# Patient Record
Sex: Female | Born: 1975 | Race: White | Hispanic: No | Marital: Married | Smoking: Never smoker
Health system: Southern US, Community
[De-identification: ages and names within clinical notes are randomized; demographics above are authoritative.]

## PROBLEM LIST (undated history)

## (undated) DIAGNOSIS — K602 Anal fissure, unspecified: Secondary | ICD-10-CM

## (undated) DIAGNOSIS — D62 Acute posthemorrhagic anemia: Secondary | ICD-10-CM

## (undated) DIAGNOSIS — O09529 Supervision of elderly multigravida, unspecified trimester: Secondary | ICD-10-CM

## (undated) DIAGNOSIS — O09899 Supervision of other high risk pregnancies, unspecified trimester: Secondary | ICD-10-CM

## (undated) DIAGNOSIS — R51 Headache: Secondary | ICD-10-CM

## (undated) DIAGNOSIS — N611 Abscess of the breast and nipple: Secondary | ICD-10-CM

## (undated) DIAGNOSIS — Z8619 Personal history of other infectious and parasitic diseases: Secondary | ICD-10-CM

## (undated) HISTORY — PX: OTHER SURGICAL HISTORY: SHX169

## (undated) HISTORY — DX: Headache: R51

## (undated) HISTORY — PX: NASAL SEPTUM SURGERY: SHX37

## (undated) HISTORY — DX: Anal fissure, unspecified: K60.2

## (undated) HISTORY — DX: Abscess of the breast and nipple: N61.1

## (undated) HISTORY — DX: Supervision of other high risk pregnancies, unspecified trimester: O09.899

## (undated) HISTORY — DX: Supervision of elderly multigravida, unspecified trimester: O09.529

## (undated) HISTORY — DX: Personal history of other infectious and parasitic diseases: Z86.19

---

## 1998-11-28 ENCOUNTER — Other Ambulatory Visit: Admission: RE | Admit: 1998-11-28 | Discharge: 1998-11-28 | Payer: Self-pay | Admitting: Obstetrics and Gynecology

## 1999-02-20 ENCOUNTER — Emergency Department (HOSPITAL_COMMUNITY): Admission: EM | Admit: 1999-02-20 | Discharge: 1999-02-20 | Payer: Self-pay | Admitting: Emergency Medicine

## 1999-11-18 ENCOUNTER — Encounter: Payer: Self-pay | Admitting: Family Medicine

## 1999-11-18 ENCOUNTER — Ambulatory Visit (HOSPITAL_COMMUNITY): Admission: RE | Admit: 1999-11-18 | Discharge: 1999-11-18 | Payer: Self-pay | Admitting: Family Medicine

## 2000-04-12 ENCOUNTER — Other Ambulatory Visit: Admission: RE | Admit: 2000-04-12 | Discharge: 2000-04-12 | Payer: Self-pay | Admitting: *Deleted

## 2000-06-01 ENCOUNTER — Ambulatory Visit (HOSPITAL_BASED_OUTPATIENT_CLINIC_OR_DEPARTMENT_OTHER): Admission: RE | Admit: 2000-06-01 | Discharge: 2000-06-01 | Payer: Self-pay | Admitting: Otolaryngology

## 2001-04-25 ENCOUNTER — Other Ambulatory Visit: Admission: RE | Admit: 2001-04-25 | Discharge: 2001-04-25 | Payer: Self-pay | Admitting: Obstetrics and Gynecology

## 2002-01-25 ENCOUNTER — Encounter: Payer: Self-pay | Admitting: Family Medicine

## 2002-01-25 ENCOUNTER — Encounter: Admission: RE | Admit: 2002-01-25 | Discharge: 2002-01-25 | Payer: Self-pay | Admitting: Family Medicine

## 2002-06-11 ENCOUNTER — Other Ambulatory Visit: Admission: RE | Admit: 2002-06-11 | Discharge: 2002-06-11 | Payer: Self-pay | Admitting: Obstetrics and Gynecology

## 2003-08-16 ENCOUNTER — Other Ambulatory Visit: Admission: RE | Admit: 2003-08-16 | Discharge: 2003-08-16 | Payer: Self-pay | Admitting: Obstetrics and Gynecology

## 2003-09-04 ENCOUNTER — Ambulatory Visit (HOSPITAL_COMMUNITY): Admission: RE | Admit: 2003-09-04 | Discharge: 2003-09-04 | Payer: Self-pay | Admitting: Otolaryngology

## 2005-06-18 ENCOUNTER — Ambulatory Visit (HOSPITAL_COMMUNITY): Admission: RE | Admit: 2005-06-18 | Discharge: 2005-06-18 | Payer: Self-pay | Admitting: Internal Medicine

## 2005-06-23 ENCOUNTER — Encounter: Admission: RE | Admit: 2005-06-23 | Discharge: 2005-06-23 | Payer: Self-pay | Admitting: Orthopedic Surgery

## 2005-07-01 ENCOUNTER — Encounter (HOSPITAL_COMMUNITY): Admission: RE | Admit: 2005-07-01 | Discharge: 2005-07-31 | Payer: Self-pay | Admitting: Internal Medicine

## 2006-12-29 ENCOUNTER — Other Ambulatory Visit: Admission: RE | Admit: 2006-12-29 | Discharge: 2006-12-29 | Payer: Self-pay | Admitting: General Surgery

## 2006-12-29 ENCOUNTER — Encounter (INDEPENDENT_AMBULATORY_CARE_PROVIDER_SITE_OTHER): Payer: Self-pay | Admitting: General Surgery

## 2007-02-24 ENCOUNTER — Ambulatory Visit (HOSPITAL_COMMUNITY): Admission: RE | Admit: 2007-02-24 | Discharge: 2007-02-24 | Payer: Self-pay | Admitting: Otolaryngology

## 2007-06-29 HISTORY — PX: OTHER SURGICAL HISTORY: SHX169

## 2008-06-04 ENCOUNTER — Ambulatory Visit: Payer: Self-pay | Admitting: Gastroenterology

## 2008-06-28 HISTORY — PX: OTHER SURGICAL HISTORY: SHX169

## 2008-11-26 ENCOUNTER — Encounter: Admission: RE | Admit: 2008-11-26 | Discharge: 2008-11-26 | Payer: Self-pay | Admitting: Orthopedic Surgery

## 2009-05-16 ENCOUNTER — Encounter (INDEPENDENT_AMBULATORY_CARE_PROVIDER_SITE_OTHER): Payer: Self-pay | Admitting: *Deleted

## 2009-06-28 HISTORY — PX: UPPER GASTROINTESTINAL ENDOSCOPY: SHX188

## 2009-07-09 ENCOUNTER — Ambulatory Visit: Payer: Self-pay | Admitting: Gastroenterology

## 2009-07-09 DIAGNOSIS — Z8711 Personal history of peptic ulcer disease: Secondary | ICD-10-CM | POA: Insufficient documentation

## 2009-07-09 DIAGNOSIS — R1013 Epigastric pain: Secondary | ICD-10-CM | POA: Insufficient documentation

## 2009-07-10 ENCOUNTER — Encounter: Payer: Self-pay | Admitting: Gastroenterology

## 2009-07-11 ENCOUNTER — Ambulatory Visit (HOSPITAL_COMMUNITY): Admission: RE | Admit: 2009-07-11 | Discharge: 2009-07-11 | Payer: Self-pay | Admitting: Gastroenterology

## 2009-07-11 ENCOUNTER — Ambulatory Visit: Payer: Self-pay | Admitting: Gastroenterology

## 2009-09-26 DIAGNOSIS — G43909 Migraine, unspecified, not intractable, without status migrainosus: Secondary | ICD-10-CM | POA: Insufficient documentation

## 2009-09-26 DIAGNOSIS — K5900 Constipation, unspecified: Secondary | ICD-10-CM | POA: Insufficient documentation

## 2009-09-26 DIAGNOSIS — K279 Peptic ulcer, site unspecified, unspecified as acute or chronic, without hemorrhage or perforation: Secondary | ICD-10-CM | POA: Insufficient documentation

## 2009-09-26 DIAGNOSIS — R109 Unspecified abdominal pain: Secondary | ICD-10-CM | POA: Insufficient documentation

## 2009-12-18 IMAGING — RF DG FLUORO GUIDE NDL PLC/BX
1 series · 1 of 1 positions shown · non-contrast
Comparison: none

CLINICAL DATA: Left shoulder pain.  Recurrent labral tear. History
of previous posterior labral tear and repair.  Chronic/recurrent
shoulder pain.

[Series 1: (hospital) · 1 of 1 slices shown]
[im 1/1]
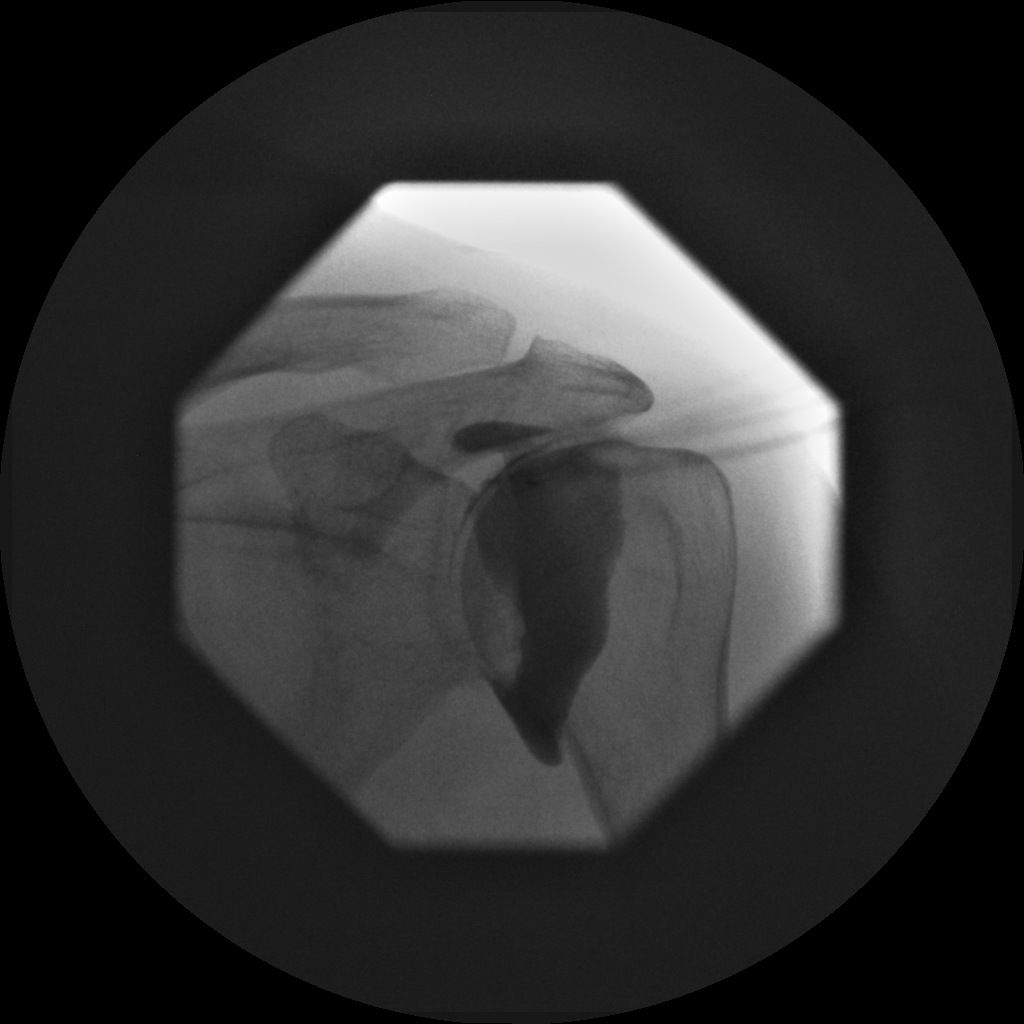

[1 of 1 positions shown; findings below may reference images not displayed]

SHOULDER ARTHROGRAM INJECTION FOR MRI

Procedure:  After a thorough discussion of risks and benefits of
the procedure, written and oral informed consent was obtained.  The
consent discussion included off label use of Magnevist, the risk of
bleeding, infection and injury to nerves and adjacent blood
vessels.  Extra-articular injection was also a possible risk
discussed.  Verbal consent was obtained by Dr. Nishita.

Preliminary localization was performed over the left shoulder.  The
area was marked over superior medial anterior humeral head.

After prep and drape in the usual sterile fashion, the skin and
deeper subcutaneous tissues were anesthetized with 1% Lidocaine
without Epinephrine.  Under fluoroscopic guidance, a 22 gauge
inch spinal needle was advanced into the joint over the superior
medial anterior humeral head using an anterior approach.  Intra-
articular injection of Lidocaine was performed which flowed freely
and subsequently the joint was distended with 10  ml of a [DATE]
dilution of Magnevist contrast.  The MR arthrogram solution was as
follows:  10 ml of Cares contrast agent, 0.2 mL Magnevist, 10 ml
of 1 % Lidocaine.  An end point was felt as well as the patient
experiencing pressure and the injection was discontinued, the
needle removed, and a sterile dressing applied. The patient was
taken to MRI for subsequent imaging.
The patient tolerated the procedure well and there were no
complications.

Fluoroscopy  Time: 1 minute 9 seconds
IMPRESSION: Successful left shoulder fluoroscopically guided MR arthrogram
injection.

## 2010-06-28 NOTE — L&D Delivery Note (Addendum)
Operative Delivery Note At 2:37 AM a viable and healthy female was delivered via Vaginal, Vacuum Investment banker, operational).  Presentation: vertex; Position: Occiput,, Anterior; Station: -2@(+)2 station.  Verbal consent: obtained from patient.  Risks and benefits discussed in detail.  Risks include, but are not limited to the risks of anesthesia, bleeding, infection, damage to maternal tissues, fetal cephalhematoma.  There is also the risk of inability to effect vaginal delivery of the head, or shoulder dystocia that cannot be resolved by established maneuvers, leading to the need for emergency cesarean section. Vacuum done for arrest of descent with pushing. APGAR: 9, 9; weight 7 lb 7.2 oz (3379 g).   Placenta status: Intact, Spontaneous.   Cord: 3 vessels with the following complications: None.  Cord pH: none  Anesthesia: Epidural  Instruments: mushroom vacuum Episiotomy: None Lacerations: 2nd degree;Sulcus;Perineal Suture Repair: 3.0 chromic Est. Blood Loss (mL): 300  Mom to postpartum.  Baby to nursery-stable.  Kim Guerrero A 06/14/2011, 3:04 AM

## 2010-07-28 NOTE — Assessment & Plan Note (Signed)
Summary: YR FU,ULCER/GU   Visit Type:  f/u Primary Care Provider:  Karleen Hampshire  Chief Complaint:  follow up- ulcer.  History of Present Illness: Kim Guerrero is here for one year f/u.  She had perforated duodenal ulcer with omental patch repair in 8/09.  She states she stopped Protonix in 12/09 when admitted at [facility] for migraine H/A and elevated blood pressure.  She states the pharmacist suspected Protonix caused the elevated BP and suggested she avoid PPIs.  She notes frequent epigastric burning like when she had the ulcer.  It's worse with acidic and spicy foods.  Water seems to help.  She has less frequent stools since her surgery.  One BM every 3 days. No melena or brbpr.  She is unhappy with her incision/scar.  She takes ibuprofen rarely but took it for approximately two weeks for dental pain.  Denies heartburn.      Current Medications (verified): 1)  Neurontin 300 Mg Caps (Gabapentin) .... 2 Three Times A Day 2)  Xanax 1 Mg Tabs (Alprazolam) .... Q 6 Hours 3)  Tylenol 325 Mg Tabs (Acetaminophen) .... 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  Social History: works for the [employer], [employer] Never used tobacco, No alcohol. No drugs. Married. No children.  Review of Systems General:  Denies fever, chills, sweats, anorexia, weakness, and weight loss. Eyes:  Denies vision loss. ENT:  Denies nasal congestion, hoarseness, and difficulty swallowing. CV:  Denies chest pains, angina, palpitations, dyspnea on exertion, and peripheral edema. Resp:  Denies dyspnea at rest, dyspnea with  exercise, cough, and sputum. GI:  See HPI. GU:  Denies urinary burning and blood in urine. MS:  Denies joint pain / LOM. Derm:  Denies rash and itching. Neuro:  Complains of frequent headaches; denies weakness, memory loss, and confusion. Psych:  Denies depression and anxiety. Endo:  Denies unusual weight change. Heme:  Denies bruising and bleeding. Allergy:  Denies hives.  Vital Signs:  Patient profile:   35 year old female Height:      65 inches Weight:      138 pounds BMI:     23.05 Temp:     97.8 degrees F oral Pulse rate:   60 / minute BP sitting:   110 / 80  (left arm) Cuff size:   regular  Vitals Entered By: Hendricks Limes LPN (July 09, 2009 2:27 PM)  Physical Exam  General:  Well developed, well nourished, no acute distress. Head:  Normocephalic and atraumatic. Eyes:  Conjunctivae pink, no scleral icterus.  Mouth:  Oropharyngeal mucosa moist, pink.  No lesions, erythema or exudate.    Neck:  Supple; no masses or thyromegaly. Lungs:  Clear throughout to auscultation. Heart:  Regular rate and rhythm; no murmurs, rubs,  or bruits. Abdomen:  Soft. ND. Incision with small bump at lower portion, mild erythema. No drainage. No HSM or masses.  Mild epig tenderness. No rebound or guarding. No abd bruit or hernia. Extremities:  No clubbing, cyanosis, edema or deformities noted. Neurologic:  Alert and  oriented x4;  grossly normal neurologically. Skin:  Intact without significant lesions or rashes. Cervical Nodes:  No significant cervical adenopathy.  Psych:  Alert and cooperative. Normal mood and affect.  Impression & Recommendations:  Problem # 1:  ABDOMINAL PAIN, EPIGASTRIC (ICD-789.06)  Chronic, intermittent epigastric pain and burning with h/o perforated duodenal ulcer repaired in 8/09.  She is not on PPI according to her due to elevated BP requiring hospitalization.  She is reluctant to taking a PPI.  Discussed with Dr. Darrick Penna.  EGD to be performed in near future.   Risks, alternatives, benefits including but not limited to risk of reaction to medications, bleeding, infection, and perforation addressed.  Patient voiced understanding and verbal consent obtained.   Orders: Est. Patient Level IV ([id number])

## 2010-07-28 NOTE — Letter (Signed)
Summary: EGD ORDER  EGD ORDER   Imported By: Ave Filter 07/10/2009 17:42:06  _____________________________________________________________________  External Attachment:    Type:   Image     Comment:   External Document

## 2010-11-10 NOTE — Assessment & Plan Note (Signed)
NAMEMarland Kitchen  Kim Guerrero, Kim Guerrero            CHART#:  [id number]   DATE:  06/04/2008                       DOB:  Oct 18, 1975   REFERRING PHYSICIAN:  Patrica Duel, MD   REASON FOR CONSULTATION:  Peptic ulcer disease.   HISTORY OF PRESENT ILLNESS:  Kim Guerrero is a 35 year old female, who  was using BC powders and Indocin without a PPI.  She was managing her  migraine headaches.  She presented in August 2009 with sudden onset of  abdominal pain, was found to have a perforated duodenal ulcer.  Dr.  Cleotis Nipper oversewn the perforated ulcer with an omental patch in August  2009.  She continues on Protonix.  She is concerned that sometimes she  gets a burning sensation in her abdomen.  Dr. Cleotis Nipper also did an  upper endoscopy in September 2009.  The CLO test showed no evidence of  H. pylori gastritis.  The upper endoscopy in September 2009 showed a  normal stomach and duodenal bulb was normal.   She denies any nausea, vomiting, fever, blood in her stool, or black  stools.  She denies using any aspirin, BC powders, or Goody's powders,  Aleve or ibuprofen.  She does not have any problem swallowing.  She has  a normal bowel movement daily.   PAST MEDICAL HISTORY:  Migraines followed by Dr. Ninetta Lights in [place].   PAST SURGICAL HISTORY:  1. She had repair of a lateral tear in her left shoulder.  2. She had LASIK surgery.  3. Wisdom teeth extraction.   ALLERGIES:  No known drug allergies.   MEDICATIONS:  Protonix 40 mg b.i.d.   FAMILY HISTORY:  She denies any family history of colon cancer or colon  polyps, breast, uterine, or ovarian cancer.   SOCIAL HISTORY:  She has been married for 5 years and has no children.  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  She denies any tobacco or  alcohol use.   REVIEW OF SYSTEMS:  Per the HPI, otherwise all systems are negative.   PHYSICAL EXAMINATION:  VITAL SIGNS:  Weight 139 pounds, height 5 feet 4  inches, temperature 97.9, blood pressure 124/82,  and pulse 80.  GENERAL:  She is in no apparent distress.  Alert and oriented x4.HEENT:  Atraumatic and normocephalic.  Pupils are equal and reactive to light.  Mouth, no oral lesions.  Posterior pharynx without erythema or  exudate.NECK:  Full range of motion.  No lymphadenopathy.LUNGS:  Clear  to auscultation bilaterally.CARDIOVASCULAR:  Regular rhythm.  No murmur.  Normal S1 and S2.ABDOMEN:  Bowel sounds are present.  Soft, nontender,  and nondistended.  No rebound or guarding.  Midline incision well  healed.EXTREMITIES:  No cyanosis or edema.NEUROLOGIC:  She has no focal  neurologic deficits.   ASSESSMENT:  Kim Guerrero is a 35 year old female, who has had a history  of perforated ulcer secondary to nonsteroidal anti-inflammatory drug  use.  Clinically, she is stable. Thank you for allowing me to see Ms.  Guerrero in consultation.  My recommendations follow.   RECOMMENDATIONS:  1. I explained to Kim Guerrero that should she return to using [facility]      powders and anti-inflammatory drug, she is at risk for recurrence      of her perforated duodenal ulcer.  She should never stop taking      Protonix.  She may titrate the dose at 1-2 a day.  She is given a      prescription for Protonix b.i.d. with a year's refills.  2. She may follow up with me in 12 months.  If she remains stable,      then after 2010 she can follow up with me as needed.       Kassie Mends, M.D.  Electronically Signed     SM/MEDQ  D:  06/04/2008  T:  06/05/2008  Job:  [id number]   cc:   Patrica Duel, M.D.

## 2010-11-11 LAB — RPR: RPR: NONREACTIVE

## 2010-11-11 LAB — ANTIBODY SCREEN: Antibody Screen: NEGATIVE

## 2010-11-11 LAB — ABO/RH

## 2010-11-11 LAB — HEPATITIS B SURFACE ANTIGEN: Hepatitis B Surface Ag: NEGATIVE

## 2010-11-13 NOTE — Op Note (Signed)
[place]  Patient:    Kim Guerrero, Kim Guerrero                  MRN: [id number] Proc. Date: 06/01/00 Adm. Date:  [id number] Attending:  Susy Frizzle CC:         [facility]   Operative Report  REFERRING PHYSICIAN:  [facility].  PREOPERATIVE DIAGNOSES: 1. Nasal septal deviation. 2. Inferior turbinate hypertrophy.  POSTOPERATIVE DIAGNOSES: 1. Nasal septal deviation. 2. Inferior turbinate hypertrophy.  PROCEDURE: 1. Nasal septoplasty. 2. Submucous resection inferior turbinates bilaterally.  SURGEON:  Jefry H. Pollyann Kennedy, M.D.  ANESTHESIA:  General endotracheal anesthesia was used.  COMPLICATIONS:  No complications.  ESTIMATED BLOOD LOSS:  40 cc.  FINDINGS:  Severe leftward deviation of the superior posterior ethmoid plate with impingement on the left middle turbinate, and deflection of the anterior nasal septum quadrangular cartilage to the right side.  Severe bony enlargement of the inferior turbinates bilaterally.  DISPOSITION:  The patient tolerated the procedure well and was awakened, extubated, and transferred to recovery in stable condition.  INDICATION FOR PROCEDURE:  This is a 35 year old with a history of nasal obstruction and severe nasal and frontal headaches that seem to be relieved very easily with topical nasal decongestant and topical anesthesia.  There are some pressure points seen on nasal endoscopy that were felt to possibly explain the chronic headaches.  There was also some chronic nasal obstruction. There is a history of nasal trauma in the past.  Risks, benefits, alternatives, limitations of the procedure were explained to the patient, who seemed to understand and agreed to surgery.  DESCRIPTION OF PROCEDURE:  The patient was taken to the operating room and placed on the operating table in a supine position.  Following induction of general endotracheal anesthesia, the patient was  draped in a standard fashion. Oxymetazoline spray was used in the nose preoperatively.  Xylocaine 1% with epinephrine was infiltrated along the septum, the columella, the floor of the nose, and the inferior turbinates bilaterally.  Afrin-soaked pledgets were then placed bilaterally.  #1. Nasal septoplasty.  A left hemitransfixion incision was created with a 15 scalpel, used to elevate a mucosal flap off the quadrangular cartilage and brought back posteriorly to the sphenoid rostrum.  The bony-cartilaginous junction was divided with a Therapist, nutritional and a similar flap was developed posteriorly down the right side.  The superior attachment of the ethmoid plate was taken down with an open Jansen-Middleton rongeur.  The posterior and inferior attachments were taken down with a Therapist, nutritional, and a larger fragment of the ethmoid plate was resected.  The vomerian bone was also deflected toward the left side and was taken down with a chisel.  The posterior half of the quadrangular cartilage was deflected and bent, obstructing the right side, and was resected, allowing sufficient support of the nasal dorsum to remain.  There was a bony prominence over the maxillary crest on the right side that was approached via a small mucosal incision of the right nasal cavity, and a section of quadrangular cartilage that was draped overlying the maxillary crest was resected with a 15 scalpel.  These maneuvers nicely allowed the septum to lie in the midline and free of any obstruction and also freed up the pressure points at the turbinates.  The mucosal incision on the left side was reapproximated with chromic suture.  a quilting suture of plain gut was used to reappose the septal flaps.  #2.  Submucous resection, inferior turbinates.  The leading edge of the inferior turbinates was incised with a 15 scalpel, and a Therapist, nutritional was used to elevate mucosa off the turbinate bone.  Large fragments of  turbinate bone were resected.  They were quite thickened and elongated.  The turbinate remnants were outfractured with the Alfreck elevator.  The nasal cavities were suctioned of blood and secretions and packed with rolled-up Telfa gauze coated with bacitracin ointment.  Pharynx was suctioned of blood and secretions under direct visualization.  The patient was then awakened, extubated, and transferred to recovery in stable condition.DD:  06/01/00 TD:  06/01/00 Job: [id number]

## 2010-11-18 ENCOUNTER — Other Ambulatory Visit: Payer: Self-pay | Admitting: Obstetrics and Gynecology

## 2011-05-12 LAB — STREP B DNA PROBE: GBS: POSITIVE

## 2011-06-08 ENCOUNTER — Encounter (HOSPITAL_COMMUNITY): Payer: Self-pay | Admitting: *Deleted

## 2011-06-08 ENCOUNTER — Telehealth (HOSPITAL_COMMUNITY): Payer: Self-pay | Admitting: *Deleted

## 2011-06-08 NOTE — Telephone Encounter (Signed)
Preadmission screen  

## 2011-06-09 ENCOUNTER — Inpatient Hospital Stay (HOSPITAL_COMMUNITY): Admission: AD | Admit: 2011-06-09 | Payer: Self-pay | Source: Ambulatory Visit | Admitting: Obstetrics and Gynecology

## 2011-06-12 ENCOUNTER — Inpatient Hospital Stay (HOSPITAL_COMMUNITY)
Admission: RE | Admit: 2011-06-12 | Discharge: 2011-06-16 | DRG: 373 | Disposition: A | Discharge status: Home / Self Care | Payer: Federal, State, Local not specified - PPO | Source: Ambulatory Visit | Attending: Obstetrics and Gynecology | Admitting: Obstetrics and Gynecology

## 2011-06-12 ENCOUNTER — Encounter (HOSPITAL_COMMUNITY): Payer: Self-pay

## 2011-06-12 DIAGNOSIS — O9903 Anemia complicating the puerperium: Secondary | ICD-10-CM | POA: Diagnosis not present

## 2011-06-12 DIAGNOSIS — O480 Post-term pregnancy: Principal | ICD-10-CM | POA: Diagnosis present

## 2011-06-12 DIAGNOSIS — D62 Acute posthemorrhagic anemia: Secondary | ICD-10-CM | POA: Diagnosis not present

## 2011-06-12 DIAGNOSIS — O09519 Supervision of elderly primigravida, unspecified trimester: Secondary | ICD-10-CM | POA: Diagnosis present

## 2011-06-12 HISTORY — DX: Acute posthemorrhagic anemia: D62

## 2011-06-12 LAB — COMPREHENSIVE METABOLIC PANEL
ALT: 15 U/L (ref 0–35)
AST: 17 U/L (ref 0–37)
Albumin: 3.1 g/dL — ABNORMAL LOW (ref 3.5–5.2)
Calcium: 10.9 mg/dL — ABNORMAL HIGH (ref 8.4–10.5)
Creatinine, Ser: 0.53 mg/dL (ref 0.50–1.10)
Sodium: 133 mEq/L — ABNORMAL LOW (ref 135–145)

## 2011-06-12 LAB — CBC
MCH: 32.4 pg (ref 26.0–34.0)
MCHC: 34.5 g/dL (ref 30.0–36.0)
Platelets: 233 10*3/uL (ref 150–400)
RBC: 3.89 MIL/uL (ref 3.87–5.11)

## 2011-06-12 MED ORDER — TERBUTALINE SULFATE 1 MG/ML IJ SOLN: 0.2500 mg | Freq: Once | INTRAMUSCULAR | Status: AC | PRN

## 2011-06-12 MED ORDER — OXYTOCIN 20 UNITS IN LACTATED RINGERS INFUSION - SIMPLE: 125.0000 mL/h | Freq: Once | INTRAVENOUS | Status: DC

## 2011-06-12 MED ORDER — ONDANSETRON HCL 4 MG/2ML IJ SOLN: 4.0000 mg | Freq: Four times a day (QID) | INTRAMUSCULAR | Status: DC | PRN

## 2011-06-12 MED ORDER — LACTATED RINGERS IV SOLN
INTRAVENOUS | Status: DC
  Administered 2011-06-12: 20:00:00 via INTRAVENOUS
  Administered 2011-06-13: 125 mL/h via INTRAVENOUS
  Administered 2011-06-13: 04:00:00 via INTRAVENOUS
  Administered 2011-06-13: 125 mL/h via INTRAVENOUS

## 2011-06-12 MED ORDER — LIDOCAINE HCL (PF) 1 % IJ SOLN
30.0000 mL | INTRAMUSCULAR | Status: DC | PRN
  Administered 2011-06-14: 30 mL via SUBCUTANEOUS
  Filled 2011-06-12: qty 30

## 2011-06-12 MED ORDER — IBUPROFEN 600 MG PO TABS: 600.0000 mg | ORAL_TABLET | Freq: Four times a day (QID) | ORAL | Status: DC | PRN

## 2011-06-12 MED ORDER — PENICILLIN G POTASSIUM 5000000 UNITS IJ SOLR
2.5000 10*6.[IU] | INTRAVENOUS | Status: DC
  Administered 2011-06-13 – 2011-06-14 (×7): 2.5 10*6.[IU] via INTRAVENOUS
  Filled 2011-06-12 (×11): qty 2.5

## 2011-06-12 MED ORDER — OXYCODONE-ACETAMINOPHEN 5-325 MG PO TABS: 2.0000 | ORAL_TABLET | ORAL | Status: DC | PRN

## 2011-06-12 MED ORDER — PENICILLIN G POTASSIUM 5000000 UNITS IJ SOLR
5.0000 10*6.[IU] | Freq: Once | INTRAVENOUS | Status: AC
  Administered 2011-06-12: 5 10*6.[IU] via INTRAVENOUS
  Filled 2011-06-12: qty 5

## 2011-06-12 MED ORDER — OXYTOCIN BOLUS FROM INFUSION
500.0000 mL | Freq: Once | INTRAVENOUS | Status: DC
  Filled 2011-06-12: qty 500
  Filled 2011-06-12: qty 1000

## 2011-06-12 MED ORDER — ZOLPIDEM TARTRATE 10 MG PO TABS
10.0000 mg | ORAL_TABLET | Freq: Every evening | ORAL | Status: DC | PRN
  Administered 2011-06-12: 10 mg via ORAL
  Filled 2011-06-12: qty 1

## 2011-06-12 MED ORDER — OXYTOCIN 20 UNITS IN LACTATED RINGERS INFUSION - SIMPLE
1.0000 m[IU]/min | INTRAVENOUS | Status: DC
  Administered 2011-06-13: 2 m[IU]/min via INTRAVENOUS
  Administered 2011-06-13: 4 m[IU]/min via INTRAVENOUS
  Administered 2011-06-14: 999 m[IU]/min via INTRAVENOUS
  Filled 2011-06-12: qty 1000

## 2011-06-12 MED ORDER — ACETAMINOPHEN 325 MG PO TABS: 650.0000 mg | ORAL_TABLET | ORAL | Status: DC | PRN

## 2011-06-12 MED ORDER — CITRIC ACID-SODIUM CITRATE 334-500 MG/5ML PO SOLN: 30.0000 mL | ORAL | Status: DC | PRN

## 2011-06-12 MED ORDER — MISOPROSTOL 25 MCG QUARTER TABLET
25.0000 ug | ORAL_TABLET | ORAL | Status: DC | PRN
  Administered 2011-06-12: 25 ug via VAGINAL
  Filled 2011-06-12: qty 0.25

## 2011-06-12 MED ORDER — OXYTOCIN 10 UNIT/ML IJ SOLN: 10.0000 [IU] | Freq: Once | INTRAMUSCULAR | Status: DC

## 2011-06-12 MED ORDER — LACTATED RINGERS IV SOLN
500.0000 mL | INTRAVENOUS | Status: DC | PRN
  Administered 2011-06-13: 500 mL via INTRAVENOUS

## 2011-06-12 NOTE — H&P (Signed)
Kim Guerrero is a 35 y.o. female presenting for induction 2nd to postdate. Uncomplicated preg. IVF pregnancy. History OB History    Grav Para Term Preterm Abortions TAB SAB Ect Mult Living   1              Past Medical History  Diagnosis Date  . Supervision of other high-risk pregnancy     B strep  . AMA (advanced maternal age) multigravida 35+   . Headache      migraine  . Anal fissure   . History of chicken pox    Past Surgical History  Procedure Date  . Shoulder durgery     Left  . Perforated ulcer and peritonitis 2009  . Arm surgery 2010    Left   Family History: family history includes Cancer in her paternal grandmother; Diabetes in her paternal grandfather; and Migraines in her paternal grandfather. Social History:  reports that she has never smoked. She has never used smokeless tobacco. She reports that she does not drink alcohol or use illicit drugs.  ROS neg    Blood pressure 139/95, pulse 101, temperature 97.4 F (36.3 C), resp. rate 20, height 5\' 4"  (1.626 m), weight 77.111 kg (170 lb), last menstrual period 09/04/2010. Exam Physical Exam  Constitutional: She is oriented to person, place, and time. She appears well-developed and well-nourished.  Eyes: EOM are normal.  Neck: Neck supple.  Respiratory: Breath sounds normal.  GI: Soft.  Musculoskeletal: Normal range of motion.  Neurological: She is alert and oriented to person, place, and time.  Skin: Skin is warm and dry.  Psychiatric: She has a normal mood and affect.   VE closed/70/-3 Prenatal labs: ABO, Rh: A/Positive/-- (05/16 0000) Antibody: Negative (05/16 0000) Rubella: Immune (05/16 0000) RPR: Nonreactive (05/16 0000)  HBsAg: Negative (05/16 0000)  HIV: Non-reactive (05/16 0000)  GBS: Positive (11/14 0000)   Assessment/Plan: Postdates GBS cx (+)  P) admit cytotec. Routine labs( PIH labs) IV penicillin. Analgesic prn. Low dose pitocin   Tavaughn Silguero A 06/12/2011, 7:49  PM

## 2011-06-13 ENCOUNTER — Encounter (HOSPITAL_COMMUNITY): Payer: Self-pay | Admitting: Anesthesiology

## 2011-06-13 ENCOUNTER — Inpatient Hospital Stay (HOSPITAL_COMMUNITY): Admission: RE | Admit: 2011-06-13 | Payer: Self-pay | Source: Ambulatory Visit

## 2011-06-13 ENCOUNTER — Ambulatory Visit (HOSPITAL_COMMUNITY): Payer: Federal, State, Local not specified - PPO | Admitting: Anesthesiology

## 2011-06-13 LAB — RPR: RPR Ser Ql: NONREACTIVE

## 2011-06-13 MED ORDER — LIDOCAINE HCL 1.5 % IJ SOLN
INTRAMUSCULAR | Status: DC | PRN
  Administered 2011-06-13: 2 mL via INTRADERMAL
  Administered 2011-06-13 (×2): 5 mL via INTRADERMAL

## 2011-06-13 MED ORDER — EPHEDRINE 5 MG/ML INJ: 10.0000 mg | INTRAVENOUS | Status: DC | PRN

## 2011-06-13 MED ORDER — LACTATED RINGERS IV SOLN
500.0000 mL | Freq: Once | INTRAVENOUS | Status: AC
  Administered 2011-06-13: 500 mL via INTRAVENOUS

## 2011-06-13 MED ORDER — PHENYLEPHRINE 40 MCG/ML (10ML) SYRINGE FOR IV PUSH (FOR BLOOD PRESSURE SUPPORT): 80.0000 ug | PREFILLED_SYRINGE | INTRAVENOUS | Status: DC | PRN

## 2011-06-13 MED ORDER — DIPHENHYDRAMINE HCL 50 MG/ML IJ SOLN: 12.5000 mg | INTRAMUSCULAR | Status: DC | PRN

## 2011-06-13 MED ORDER — EPHEDRINE 5 MG/ML INJ
10.0000 mg | INTRAVENOUS | Status: DC | PRN
  Filled 2011-06-13: qty 4

## 2011-06-13 MED ORDER — FENTANYL 2.5 MCG/ML BUPIVACAINE 1/10 % EPIDURAL INFUSION (WH - ANES)
14.0000 mL/h | INTRAMUSCULAR | Status: DC
  Administered 2011-06-13 (×3): 14 mL/h via EPIDURAL
  Filled 2011-06-13 (×3): qty 60

## 2011-06-13 MED ORDER — PHENYLEPHRINE 40 MCG/ML (10ML) SYRINGE FOR IV PUSH (FOR BLOOD PRESSURE SUPPORT)
80.0000 ug | PREFILLED_SYRINGE | INTRAVENOUS | Status: DC | PRN
  Filled 2011-06-13: qty 5

## 2011-06-13 MED ORDER — NALBUPHINE SYRINGE 5 MG/0.5 ML
10.0000 mg | INJECTION | INTRAMUSCULAR | Status: DC | PRN
  Administered 2011-06-13: 10 mg via INTRAVENOUS
  Filled 2011-06-13 (×2): qty 1

## 2011-06-13 NOTE — Progress Notes (Signed)
S: comfortable some rectal pressure Pitocin 24 MIU O: Ve per RN" 6/90/-1 bloody show T98.7 Tracing: baseline 140 some variable decels. (+) accels Ctx q 2-  ImP: Active phase' GBS cx (+) on PCN Postdate  P) cont present mgmt

## 2011-06-13 NOTE — Progress Notes (Signed)
S: cramping  O: 1 cm/70%/-3/-2 Arom clear fluid. Tunnel cervix  Tracing: baseline 130 reactive. Ctx q 1-3 mins  A/P: Postdates GBS cx (+) PCN  P) cont increase pitocin Analgesic prn IV PCN Exaggerated left sims position

## 2011-06-13 NOTE — Anesthesia Procedure Notes (Signed)
Epidural Patient location during procedure: OB Start time: 06/13/2011 2:40 PM Reason for block: procedure for pain  Staffing Performed by: anesthesiologist   Preanesthetic Checklist Completed: patient identified, site marked, surgical consent, pre-op evaluation, timeout performed, IV checked, risks and benefits discussed and monitors and equipment checked  Epidural Patient position: sitting Prep: site prepped and draped and DuraPrep Patient monitoring: continuous pulse ox and blood pressure Approach: midline Injection technique: LOR air  Needle:  Needle type: Tuohy  Needle gauge: 17 G Needle length: 9 cm Catheter type: closed end flexible Catheter size: 19 Gauge Test dose: negative  Assessment Events: blood not aspirated, injection not painful, no injection resistance, negative IV test and no paresthesia  Additional Notes Discussed risk of headache, infection, bleeding, nerve injury and failed or incomplete block.  Patient voices understanding and wishes to proceed.

## 2011-06-13 NOTE — Progress Notes (Signed)
S: notes ctxs had Nubain  O:  tracing. Baseline 125 reactive (+) large accels Ctx q 2 to  2 1/2 mins  125 MV units  Pitocin 18 MIU  VE: 2/70/-1  IMP: latent phase suboptimal labor Postdates GBS cx (+) on PCN  P) cont increase pitocin

## 2011-06-13 NOTE — Progress Notes (Signed)
S: Epidural. Feels nothing Temp 98.2 O: Ve: 4 cm /thick/-1 ? ROT  Tracing; baseline 130 reactive. Ctx q 2 1/2 to 3 mins ( 160-190 MV units)  Pitocin 24 MIU  IMP: postdates Protracted latent phase due to suboptimal ctx GBS cx (+)  P) decrease pitocin to 8 MIU(down regulation) Exaggerated sims position

## 2011-06-13 NOTE — Anesthesia Preprocedure Evaluation (Signed)
Anesthesia Evaluation  Patient identified by MRN, date of birth, ID band Patient awake    Reviewed: Allergy & Precautions, H&P , NPO status , Patient's Chart, lab work & pertinent test results, reviewed documented beta blocker date and time   History of Anesthesia Complications Negative for: history of anesthetic complications  Airway Mallampati: I TM Distance: >3 FB Neck ROM: full    Dental  (+) Teeth Intact   Pulmonary neg pulmonary ROS,  clear to auscultation        Cardiovascular neg cardio ROS regular Normal    Neuro/Psych  Headaches (migraines - last in early pregnancy, headaches frequently), Negative Psych ROS   GI/Hepatic negative GI ROS, Neg liver ROS, PUD,   Endo/Other  Negative Endocrine ROS  Renal/GU negative Renal ROS  Genitourinary negative   Musculoskeletal   Abdominal   Peds  Hematology negative hematology ROS (+)   Anesthesia Other Findings   Reproductive/Obstetrics (+) Pregnancy                           Anesthesia Physical Anesthesia Plan  ASA: II  Anesthesia Plan: Epidural   Post-op Pain Management:    Induction:   Airway Management Planned:   Additional Equipment:   Intra-op Plan:   Post-operative Plan:   Informed Consent: I have reviewed the patients History and Physical, chart, labs and discussed the procedure including the risks, benefits and alternatives for the proposed anesthesia with the patient or authorized representative who has indicated his/her understanding and acceptance.     Plan Discussed with:   Anesthesia Plan Comments:         Anesthesia Quick Evaluation

## 2011-06-14 ENCOUNTER — Encounter (HOSPITAL_COMMUNITY): Payer: Self-pay

## 2011-06-14 MED ORDER — WITCH HAZEL-GLYCERIN EX PADS: 1.0000 "application " | MEDICATED_PAD | CUTANEOUS | Status: DC | PRN

## 2011-06-14 MED ORDER — BENZOCAINE-MENTHOL 20-0.5 % EX AERO
INHALATION_SPRAY | CUTANEOUS | Status: AC
  Filled 2011-06-14: qty 56

## 2011-06-14 MED ORDER — ZOLPIDEM TARTRATE 5 MG PO TABS: 5.0000 mg | ORAL_TABLET | Freq: Every evening | ORAL | Status: DC | PRN

## 2011-06-14 MED ORDER — DIPHENHYDRAMINE HCL 25 MG PO CAPS: 25.0000 mg | ORAL_CAPSULE | Freq: Four times a day (QID) | ORAL | Status: DC | PRN

## 2011-06-14 MED ORDER — SENNOSIDES-DOCUSATE SODIUM 8.6-50 MG PO TABS
2.0000 | ORAL_TABLET | Freq: Every day | ORAL | Status: DC
  Administered 2011-06-14 – 2011-06-15 (×2): 2 via ORAL

## 2011-06-14 MED ORDER — DIBUCAINE 1 % RE OINT
1.0000 "application " | TOPICAL_OINTMENT | RECTAL | Status: DC | PRN
  Filled 2011-06-14: qty 28

## 2011-06-14 MED ORDER — OXYCODONE-ACETAMINOPHEN 5-325 MG PO TABS: 1.0000 | ORAL_TABLET | ORAL | Status: DC | PRN

## 2011-06-14 MED ORDER — PRENATAL PLUS 27-1 MG PO TABS
1.0000 | ORAL_TABLET | Freq: Every day | ORAL | Status: DC
  Administered 2011-06-14 – 2011-06-16 (×3): 1 via ORAL
  Filled 2011-06-14 (×3): qty 1

## 2011-06-14 MED ORDER — ONDANSETRON HCL 4 MG PO TABS: 4.0000 mg | ORAL_TABLET | ORAL | Status: DC | PRN

## 2011-06-14 MED ORDER — LANOLIN HYDROUS EX OINT: TOPICAL_OINTMENT | CUTANEOUS | Status: DC | PRN

## 2011-06-14 MED ORDER — IBUPROFEN 600 MG PO TABS
600.0000 mg | ORAL_TABLET | Freq: Four times a day (QID) | ORAL | Status: DC
  Administered 2011-06-14 – 2011-06-16 (×10): 600 mg via ORAL
  Filled 2011-06-14 (×11): qty 1

## 2011-06-14 MED ORDER — ONDANSETRON HCL 4 MG/2ML IJ SOLN: 4.0000 mg | INTRAMUSCULAR | Status: DC | PRN

## 2011-06-14 MED ORDER — TETANUS-DIPHTH-ACELL PERTUSSIS 5-2.5-18.5 LF-MCG/0.5 IM SUSP
0.5000 mL | Freq: Once | INTRAMUSCULAR | Status: AC
  Administered 2011-06-15: 0.5 mL via INTRAMUSCULAR
  Filled 2011-06-14: qty 0.5

## 2011-06-14 MED ORDER — FERROUS SULFATE 325 (65 FE) MG PO TABS
325.0000 mg | ORAL_TABLET | Freq: Two times a day (BID) | ORAL | Status: DC
  Administered 2011-06-14 – 2011-06-15 (×4): 325 mg via ORAL
  Filled 2011-06-14 (×5): qty 1

## 2011-06-14 MED ORDER — SIMETHICONE 80 MG PO CHEW: 80.0000 mg | CHEWABLE_TABLET | ORAL | Status: DC | PRN

## 2011-06-14 MED ORDER — BENZOCAINE-MENTHOL 20-0.5 % EX AERO: 1.0000 "application " | INHALATION_SPRAY | CUTANEOUS | Status: DC | PRN

## 2011-06-14 NOTE — Progress Notes (Signed)
Dr cousins discussing risks and benefits of vacuum assisted delivery, pt verbalizes and understands, to proceed with vacuum delivery, see dr note

## 2011-06-14 NOTE — Anesthesia Postprocedure Evaluation (Signed)
  Anesthesia Post-op Note  Patient: Kim Guerrero  Procedure(s) Performed: * No procedures listed *  Patient Location: Mother/Baby  Anesthesia Type: Epidural  Level of Consciousness: awake, alert  and oriented  Airway and Oxygen Therapy: Patient Spontanous Breathing  Post-op Pain: none  Post-op Assessment: Post-op Vital signs reviewed, Patient's Cardiovascular Status Stable, No headache, No backache, No residual numbness and No residual motor weakness  Post-op Vital Signs: Reviewed and stable  Complications: No apparent anesthesia complications

## 2011-06-14 NOTE — Progress Notes (Signed)
Patient ID: Kim Guerrero, female   DOB: 09/25/1975, 35 y.o.   MRN: [id number]  PPD 0 SVD  S:  Reports feeling tired - exhausted             Tolerating po/ No nausea or vomiting             Bleeding is moderate             Pain controlled withprescription NSAID's including motrin             Up ad lib / ambulatory  Newborn breast feeding  / Circumcision planned   O:  A & O x 3 NAD             VS: Blood pressure 132/81, pulse 140, temperature 99.1 F (37.3 C), temperature source Oral, resp. rate 18, height 5\' 4"  (1.626 m), weight 77.111 kg (170 lb), last menstrual period 09/04/2010, SpO2 100.00%, unknown if currently breastfeeding.  Lungs: Clear and unlabored  Heart: regular rate and rhythm / no mumurs  Abdomen: soft, non-tender, non-distended              Fundus: firm, non-tender, Ueven  Perineum: mild edema - ice pack in place  Lochia: moderate  Extremities: no edema, no calf pain or tenderness    A: PPD # 0 SVD female   Doing well - stable status  P:  Routine post partum orders    Arrion Burruel 06/14/2011, 9:08 AM

## 2011-06-14 NOTE — Progress Notes (Signed)
Called out to desk to call dr cousins to come for delivery

## 2011-06-14 NOTE — Progress Notes (Signed)
S> notes some rectal pressure with ctx  O: Ve: 9 cm/100/0 per RN  Tracing: reactive w/ occ variable decel Ctx q 2-3 mins  IMP: Active phase GBS cx (+) on IV PCN Postdates  P) reexamine 1 hr

## 2011-06-15 ENCOUNTER — Encounter (HOSPITAL_COMMUNITY): Payer: Self-pay

## 2011-06-15 DIAGNOSIS — D62 Acute posthemorrhagic anemia: Secondary | ICD-10-CM

## 2011-06-15 HISTORY — DX: Acute posthemorrhagic anemia: D62

## 2011-06-15 LAB — CBC
HCT: 26.4 % — ABNORMAL LOW (ref 36.0–46.0)
MCHC: 34.1 g/dL (ref 30.0–36.0)
Platelets: 154 10*3/uL (ref 150–400)
RDW: 14 % (ref 11.5–15.5)

## 2011-06-15 MED ORDER — POLYSACCHARIDE IRON 150 MG PO CAPS
150.0000 mg | ORAL_CAPSULE | Freq: Every day | ORAL | Status: DC
  Administered 2011-06-16: 150 mg via ORAL
  Filled 2011-06-15 (×2): qty 1

## 2011-06-15 MED ORDER — DOCUSATE SODIUM 100 MG PO CAPS
100.0000 mg | ORAL_CAPSULE | Freq: Every day | ORAL | Status: DC
  Administered 2011-06-15 – 2011-06-16 (×2): 100 mg via ORAL
  Filled 2011-06-15 (×2): qty 1

## 2011-06-15 NOTE — Progress Notes (Signed)
PPD 1 SVD  S:  Reports feeling tired, minimal sleep during night.             Tolerating po/ No nausea or vomiting             Bleeding is moderate, one small clot during night.             Pain controlled withprescription NSAID's including motrin             Up ad lib / ambulatory  Newborn  Information for the patient's newborn:  Joelyn, Lover [[id number]]  female  breast feeding  / Circumcision done   O:  A & O x 3 NAD             VS: Blood pressure 108/76, pulse 93, temperature 97.7 F (36.5 C), temperature source Oral, resp. rate 18, height 5\' 4"  (1.626 m), weight 77.111 kg (170 lb), last menstrual period 09/04/2010, SpO2 98.00%   LABS:  Basename 06/15/11 0525 06/12/11 1941  HGB 9.0* 12.6  HCT 26.4* 36.5    I&O:        Lungs: Clear and unlabored  Heart: regular rate and rhythm / no mumurs  Abdomen: soft, non-tender, non-distended              Fundus: firm, non-tender, U-1  Perineum: intact repair, minimal edema  Lochia: small  Extremities: no edema, no calf pain or tenderness, neg Homans    A/P: PPD # 1 35 y.o., G1P1001 S/P:vacuum extraction   Principal Problem:  *Postpartum care following vaginal delivery (12/17) Active Problems:  Acute blood loss anemia start oral Fe and colace   Doing well - stable status  Increased rest today, sleep when baby sleeping  Routine post partum orders  Anticipate discharge home in AM.   PAUL,DANIELA, CNM, MSN 06/15/2011, 9:19 AM

## 2011-06-16 ENCOUNTER — Encounter (HOSPITAL_COMMUNITY)
Admission: RE | Admit: 2011-06-16 | Discharge: 2011-06-16 | Disposition: A | Discharge status: Home / Self Care | Payer: Federal, State, Local not specified - PPO | Source: Ambulatory Visit | Attending: Obstetrics and Gynecology | Admitting: Obstetrics and Gynecology

## 2011-06-16 DIAGNOSIS — O923 Agalactia: Secondary | ICD-10-CM | POA: Insufficient documentation

## 2011-06-16 MED ORDER — IBUPROFEN 600 MG PO TABS: 600.0000 mg | ORAL_TABLET | Freq: Four times a day (QID) | ORAL | Status: AC

## 2011-06-16 NOTE — Progress Notes (Signed)
Patient ID: Kim Guerrero, female   DOB: 1975/07/30, 35 y.o.   MRN: [id number]   PPD 2 SVD  S:  Reports feeling well - ready for discharge to home             Tolerating po/ No nausea or vomiting             Bleeding is light             Pain controlled withprescription NSAID's including motrin             Up ad lib / ambulatory  Newborn breast feeding  / Circumcision done   O:  A & O x 3 NAD             VS: Blood pressure 115/75, pulse 101, temperature 97.7 F (36.5 C), temperature source Oral, resp. rate 18, height 5\' 4"  (1.626 m), weight 77.111 kg (170 lb), last menstrual period 09/04/2010, SpO2 98.00%, unknown if currently breastfeeding.  Lungs: unlabored  Heart: regular rate and rhythm  Abdomen: soft, non-tender, non-distended              Fundus: firm, non-tender, U-1  Perineum: mild edema  Lochia: light  Extremities: no edema, no calf pain or tenderness    A: PPD # 2   Doing well - stable status  P:  Routine post partum orders  Discharge to home   [facility] 06/16/2011, 9:44 AM

## 2011-06-16 NOTE — Discharge Summary (Signed)
Obstetric Discharge Summary Reason for Admission: induction of labor 2nd to postterm pregnancy Prenatal Procedures: NST and ultrasound Intrapartum Procedures: vacuum and GBS prophylaxis Postpartum Procedures: none Complications-Operative and Postpartum: 2 degree perineal laceration Hemoglobin  Date Value Range Status  06/15/2011 9.0* 12.0-15.0 (g/dL) Final     HCT  Date Value Range Status  06/15/2011 26.4* 36.0-46.0 (%) Final    Discharge Diagnoses:Post Term Pregnancy-delivered with mild acute blood loss anemia  Discharge Information: Date: 06/16/2011 Activity: pelvic rest Diet: routine Medications: PNV and Ibuprofen and iron Condition: stable Instructions: refer to practice specific booklet Discharge to: home Follow-up Information    Follow up with Calani Gick A, MD. Make an appointment in 6 weeks.   Contact information:   [address] [place] [address] [phone]          Newborn Data: Live born female  Birth Weight: 7 lb 7.2 oz (3379 g) APGAR: 9, 9  Home with mother.  Kim Guerrero,Kim Guerrero 06/16/2011, 10:28 AM

## 2011-07-06 ENCOUNTER — Encounter (INDEPENDENT_AMBULATORY_CARE_PROVIDER_SITE_OTHER): Payer: Self-pay | Admitting: Surgery

## 2011-07-06 ENCOUNTER — Ambulatory Visit (INDEPENDENT_AMBULATORY_CARE_PROVIDER_SITE_OTHER): Payer: Federal, State, Local not specified - PPO | Admitting: Surgery

## 2011-07-06 VITALS — BP 112/76 | HR 66 | Temp 97.2°F | Resp 16 | Ht 63.0 in | Wt 147.0 lb

## 2011-07-06 DIAGNOSIS — O9122 Nonpurulent mastitis associated with the puerperium: Secondary | ICD-10-CM

## 2011-07-06 MED ORDER — OXYCODONE-ACETAMINOPHEN 5-325 MG PO TABS: 1.0000 | ORAL_TABLET | ORAL | Status: AC | PRN

## 2011-07-06 NOTE — Patient Instructions (Signed)
Warm compresses.  Return to clinic on Friday.  Continue antibiotics.  Pain medication given.   Patient is no longer breast feeding.  Breastfeeding, Mastitis Breastfeeding is usually the best way to feed your baby. Breastfed babies tend to be healthier and less affected by disease. Breastfed babies may have better brain development and be less likely to be overweight than formula-fed babies. Breastfeeding also benefits the mother. Breastfeeding reduces the risk of breast cancer. It will give you time to be close to your baby and helps create a strong bond. It also delays the return of your periods, stimulates your uterus to contract back to normal, and helps you lose some of the weight you gained during pregnancy. Breastfeeding should not be painful. If there is tenderness at first, it should gradually go away as the days go by. Poor latch-on and positioning are common causes of sore nipples. This is usually because the baby is not getting enough of the areola (the colored portion around the nipple) into his or her mouth, and is sucking mostly on the nipple. In general, nurse early and often, nurse with the nipple and areola in the baby's mouth, not just the nipple and feed your baby on demand. CAUSES  It is common for many women to have a plugged duct in the breast at some point if she breastfeeds. A plugged milk duct feels like a tender, sore, lump in the breast. It is not accompanied by a fever or other symptoms. It happens when a milk duct does not properly drain, and the breast becomes inflamed (red and sore). Then, pressure builds up behind the plug. A plugged duct usually only occurs in one breast at a time.   A breast infection (mastitis), on the other hand, is soreness or a lump in the breast that can be accompanied by a fever or flu-like symptoms, such as feeling run down or very achy. Some women with a breast infection also have nausea and vomiting. You also may have yellowish discharge from the  nipple that looks like colostrum, or the breasts feel warm or hot to the touch and appear pink or red. A breast infection can occur when other family members have a cold or the flu, and like a plugged duct, it usually only occurs in one breast. It is not always easy to tell the difference between a breast infection and a plugged duct because both have similar symptoms and can improve within 24 to 48 hours.   Using one position to breastfeed may not empty your breasts properly or completely and could lead to engorgement and mastitis.   Wearing a bra that is too tight may restrict the flow of your milk.   Mastitis develops because bacteria get under the skin through cracks in the nipple and into the breasts and an infection develops.  TREATMENT   Breastfeed or pump the breast more often on the affected side. This helps loosen the plug, keeps the milk moving freely, and the breast from becoming overly full. Nursing every 2 hours, both day and night on the affected side first can be helpful.   Getting extra sleep or relaxing with your feet up can help speed healing. Often, a plugged duct or breast infection is the first sign that a mother is doing too much and becoming overly tired.   Wear a well-fitting, supportive bra that is not too tight, since this can constrict milk ducts.   If you do not feel better within 24 hours of  trying these steps, and you have a fever or your symptoms worsen, call your caregiver. You may need an antibiotic. If you have a breast infection in which both breasts look affected, or there is pus or blood in the milk, red streaks near the painful area, or your symptoms came suddenly, see your caregiver right away.   Even if you need an antibiotic, continuing to breastfeed during treatment is best for both you and your baby. Most antibiotics passed in your breast milk will not hurt your baby.   Increase your fluid intake especially if you have a fever   If mastitis is not  treated quickly and properly, you may develop a breast abscess.  THRUSH  Thrush (yeast) is a fungal infection that can form on your nipples or in your breast because it thrives on milk.  The infection forms from an overgrowth of the candida organism. Candida usually lives in our bodies and is kept at healthy levels by the natural bacteria in our bodies. But, when the natural balance of bacteria is upset, candida can overgrow, causing an infection. Some of the things that can cause thrush include:   Having an overly moist environment on your skin or nipples that are sore or cracked.   Taking antibiotics, birth control pills, or steroids.   Having a diet that contains large amounts of sugar or foods with yeast.   Women with diabetes have a higher incidence of thrush and fungal infections.   Having a chronic illness like HIV infection, diabetes, or anemia.  These are symptoms of thrush:  Having sore nipples that last more than a few days, even when your baby's latch and positioning is correct.   Getting sore nipples suddenly after several weeks of normal nursing.   Pink, flaky, shiny, itchy, or cracked nipples, or deep pink and blistered nipples.   Shooting pains deep in the breast during or after feedings, or achy breasts. This may be from a candida infection of the milk ducts.   The infection can form in your baby's mouth from having contact with your nipples, and appear as little white spots on the inside of the cheeks, gums, or tongue. It also can appear as a diaper rash (small red dots around a rash) on your baby that will not go away by using regular diaper rash ointments. Many babies with thrush refuse to nurse, or are gassy or cranky.  HOME CARE INSTRUCTIONS  If you or your baby have any of the above symptoms, contact your caregiver and your baby's caregiver so you both can be correctly diagnosed.   You can get medication for your nipples and for your baby. Medication for a mother  may be an ointment for the nipples, and your baby can be given a liquid medication for his or her mouth, or an ointment for any diaper rash.   Change disposable nursing pads often and wash any towels or clothing that have come in contact with the yeast in very hot water above 122 F (50 C).   Wear a clean bra every day.   Only use cotton pads.   Wash your hands often, and wash your baby's hands often, especially if he or she sucks on his or her fingers.   Boil any pacifiers, bottle nipples, or toys your baby puts in his or her mouth once a day for 20 minutes to kill the thrush. After 1 week of treatment, discard pacifiers and nipples and buy new ones. Boil all breast  pump parts that touch the milk for 20 minutes daily.   Make sure other family members are free of thrush or other fungal infections. If they have symptoms, get treatment for them.  MAKE SURE YOU:   Understand these instructions.   Will watch your condition.   Will get help right away if you are not doing well or get worse.  Document Released: 10/09/2004 Document Revised: 02/24/2011 Document Reviewed: 02/28/2008 [facility] Patient Information 2012 [place], [place].

## 2011-07-06 NOTE — Progress Notes (Signed)
Patient ID: Kim Guerrero, female   DOB: 1976/06/25, 36 y.o.   MRN: [id number]  Chief Complaint  Patient presents with  . Follow-up    Breast abscesses    HPI AYLSSA Guerrero is a 36 y.o. female.   HPI The patient presents at the request of Dr.Taavon due to redness and swelling of both breasts. Distally one week ago. She has significant pain and redness involving the right breast around the nipple. The left breast was draining pus but this stopped it has been read as well. She had been breast-feeding a 87-week-old child to recently. She has significant redness, pain and drainage from both breasts involving both nipples right greater than left.  Past Medical History  Diagnosis Date  . Supervision of other high-risk pregnancy     B strep  . AMA (advanced maternal age) multigravida 35+   . Headache      migraine  . Anal fissure   . History of chicken pox   . Acute blood loss anemia 06/15/2011  . Breast abscess of female     both    Past Surgical History  Procedure Date  . Shoulder durgery     Left  . Perforated ulcer and peritonitis 2009  . Arm surgery 2010    Left    Family History  Problem Relation Age of Onset  . Cancer Paternal Grandmother     colon  . Migraines Paternal Grandfather   . Cancer Paternal Grandfather     lung    Social History History  Substance Use Topics  . Smoking status: Never Smoker   . Smokeless tobacco: Never Used  . Alcohol Use: No    No Known Allergies  Current Outpatient Prescriptions  Medication Sig Dispense Refill  . acetaminophen (TYLENOL) 500 MG tablet Take 1,000 mg by mouth every 6 (six) hours as needed. Head ache       . dicloxacillin (DYNAPEN) 500 MG capsule 4 times daily.      Marland Kitchen gabapentin (NEURONTIN) 600 MG tablet Take 600 mg by mouth 3 (three) times daily.        . prenatal vitamin w/FE, FA (PRENATAL 1 + 1) 27-1 MG TABS Take 1 tablet by mouth daily.        Marland Kitchen oxyCODONE-acetaminophen (ROXICET) 5-325 MG per tablet Take  1 tablet by mouth every 4 (four) hours as needed for pain.  30 tablet  0    Review of Systems Review of Systems  Constitutional: Positive for fever and fatigue.  HENT: Negative.   Eyes: Negative.   Respiratory: Negative.   Cardiovascular: Negative.   Gastrointestinal: Negative.   Genitourinary: Negative.   Musculoskeletal: Negative.   Neurological: Negative.   Hematological: Negative.   Psychiatric/Behavioral: Negative.     Blood pressure 112/76, pulse 66, temperature 97.2 F (36.2 C), temperature source Temporal, resp. rate 16, height 5\' 3"  (1.6 m), weight 147 lb (66.679 kg), last menstrual period 09/04/2010.  Physical Exam Physical Exam  Constitutional: She is oriented to person, place, and time. She appears well-developed and well-nourished.  HENT:  Head: Normocephalic and atraumatic.  Eyes: EOM are normal. Pupils are equal, round, and reactive to light.  Neck: Normal range of motion. Neck supple.  Pulmonary/Chest:       Both breasts examined. Significant induration of the right nipple and areolar region. Left nipple shows minimal redness and no fluctuance and no drainage. Right nipple shows mild fluctuance at around 1:00 and a second pocket in the upper outer quadrant  at about 11:00. Ultrasound was done of the breast. The a 12 MHz probe was used. There is significant edema. There appeared to be 2 small pockets of hyperechoic area at 1:00 and 11:00. I discussed this with the patient recommended an attempted aspiration. Under sterile conditions using one percent lidocaine with epinephrine the skin was injected with this. 18 needle was passed into these regions with the help of ultrasound. No fluid could be aspirated from these areas.  Musculoskeletal: Normal range of motion.  Neurological: She is alert and oriented to person, place, and time.  Skin: Skin is warm and dry. There is erythema.     Psychiatric: She has a normal mood and affect. Her behavior is normal. Judgment and  thought content normal.    Data Reviewed  Assessment    Bilateral mastitis right greater than left    Plan    At this point there is no fluid that I can aspirate from these areas. She is on doxycycline and told her to complete that. I gave her Percocet today for pain and recommend followup in 2 days for recheck. If she is no better, and incision and drainage to be done and hopefully she will have more mature fluid collections to drain. I recommend warm compresses at this point and she is not breast-feeding.       Selinda Korzeniewski A. 07/06/2011, 4:41 PM

## 2011-07-09 ENCOUNTER — Encounter (INDEPENDENT_AMBULATORY_CARE_PROVIDER_SITE_OTHER): Payer: Self-pay | Admitting: Surgery

## 2011-07-09 ENCOUNTER — Ambulatory Visit (INDEPENDENT_AMBULATORY_CARE_PROVIDER_SITE_OTHER): Payer: Federal, State, Local not specified - PPO | Admitting: Surgery

## 2011-07-09 VITALS — BP 122/78 | HR 68 | Temp 97.8°F | Resp 16 | Ht 64.0 in | Wt 147.8 lb

## 2011-07-09 DIAGNOSIS — N61 Mastitis without abscess: Secondary | ICD-10-CM

## 2011-07-09 DIAGNOSIS — N611 Abscess of the breast and nipple: Secondary | ICD-10-CM

## 2011-07-09 NOTE — Progress Notes (Signed)
The patient returns to day for followup of a right breast abscess. She's been on doxycycline with minimal improvement. No fever or chills.  Exam: Right breast erythema is improved. Fluctuant area noted at 1:00 small scab. I discussed incision and drainage of this and she agreed to proceed. Under sterile conditions I prepped the area of the right nipple just around 1:00 and 1% lidocaine was used for local. Scalpel was used and a large amount of pus was drained from the area after incision of this area. I was able to milk some more pus and placed quarter inch packing into the opening. She tolerated the procedure.  Impression right breast abscess status post incision and drainage  Plan: I've asked her to keep the packing in place until next week. She is on doxycycline and continue pain medicine. I've asked her again in the shower and milk this area.

## 2011-07-09 NOTE — Patient Instructions (Signed)
Leave packing in place for 3 days.  OK to shower.  Keep on antibiotics.  Follow up 1 week.

## 2011-07-16 ENCOUNTER — Encounter (INDEPENDENT_AMBULATORY_CARE_PROVIDER_SITE_OTHER): Payer: Self-pay | Admitting: Surgery

## 2011-07-16 ENCOUNTER — Telehealth (INDEPENDENT_AMBULATORY_CARE_PROVIDER_SITE_OTHER): Payer: Self-pay | Admitting: Surgery

## 2011-07-16 ENCOUNTER — Ambulatory Visit (INDEPENDENT_AMBULATORY_CARE_PROVIDER_SITE_OTHER): Payer: Federal, State, Local not specified - PPO | Admitting: Surgery

## 2011-07-16 VITALS — BP 120/80 | HR 60 | Temp 98.2°F | Resp 12 | Ht 64.0 in | Wt 147.0 lb

## 2011-07-16 DIAGNOSIS — N61 Mastitis without abscess: Secondary | ICD-10-CM

## 2011-07-16 DIAGNOSIS — N611 Abscess of the breast and nipple: Secondary | ICD-10-CM

## 2011-07-16 MED ORDER — DOXYCYCLINE HYCLATE 100 MG PO TABS: 100.0000 mg | ORAL_TABLET | Freq: Two times a day (BID) | ORAL | Status: AC

## 2011-07-16 NOTE — Progress Notes (Signed)
The patient returns to day for followup of a right breast abscess.She is feeling better but still has some drainage from the i and d site.  No fevers. Pain a little better.    Exam: Right breast erythema is improved.  Less swollen.  Induration present.  Redness  involves the periareolar area.  Open wound noted.  Impression right breast abscess status post incision and drainage  Plan: Refill antibiotics.  . She is on doxycycline and continue pain medicine. I've asked her again to massage the breast in the shower and milk this area.sHE IS NOT BREAST FEEDING.

## 2011-07-16 NOTE — Telephone Encounter (Signed)
APPT MADE WITH DR. Luisa Hart ON 07-22-11/PT AWARE/GY

## 2011-07-16 NOTE — Patient Instructions (Signed)
Massage breast in shower.  Will refill antibiotics.  Return 1 week.

## 2011-07-17 ENCOUNTER — Encounter (HOSPITAL_COMMUNITY)
Admission: RE | Admit: 2011-07-17 | Discharge: 2011-07-17 | Disposition: A | Discharge status: Home / Self Care | Payer: Federal, State, Local not specified - PPO | Source: Ambulatory Visit | Attending: Obstetrics and Gynecology | Admitting: Obstetrics and Gynecology

## 2011-07-17 DIAGNOSIS — O923 Agalactia: Secondary | ICD-10-CM | POA: Insufficient documentation

## 2011-07-22 ENCOUNTER — Ambulatory Visit (INDEPENDENT_AMBULATORY_CARE_PROVIDER_SITE_OTHER): Payer: Federal, State, Local not specified - PPO | Admitting: Surgery

## 2011-07-22 VITALS — BP 114/80 | HR 72 | Temp 98.2°F | Resp 12 | Ht 64.0 in | Wt 147.0 lb

## 2011-07-22 DIAGNOSIS — O9122 Nonpurulent mastitis associated with the puerperium: Secondary | ICD-10-CM | POA: Insufficient documentation

## 2011-07-22 NOTE — Progress Notes (Signed)
The patient returns to day for followup of a right breast abscess.She is feeling much better but still has some drainage from the i and d site.  No fevers. Pain a lot better.    Exam: Right breast erythema is improved.  Less swollen.  Induration present.  Redness  involves the periareolar area.  Open wound noted.  Impression right breast abscess status post incision and drainage  Plan:  . She is on doxycycline and continue pain medicine. I've asked her again to massage the breast in the shower and milk this area.sHE IS NOT BREAST FEEDING.

## 2011-07-22 NOTE — Patient Instructions (Signed)
Follow up 10 days

## 2011-08-02 ENCOUNTER — Encounter (INDEPENDENT_AMBULATORY_CARE_PROVIDER_SITE_OTHER): Payer: Self-pay | Admitting: Surgery

## 2011-08-02 ENCOUNTER — Ambulatory Visit (INDEPENDENT_AMBULATORY_CARE_PROVIDER_SITE_OTHER): Payer: Federal, State, Local not specified - PPO | Admitting: Surgery

## 2011-08-02 VITALS — BP 102/62 | HR 77 | Temp 97.6°F | Ht 64.0 in | Wt 144.6 lb

## 2011-08-02 DIAGNOSIS — N61 Mastitis without abscess: Secondary | ICD-10-CM

## 2011-08-02 NOTE — Patient Instructions (Signed)
Follow up 1 month

## 2011-08-02 NOTE — Progress Notes (Signed)
The patient returns to day for followup of a right breast abscess.She is feeling much better but still has some drainage from the i and d site.  No fevers. Pain a lot better.    Exam: Right breast erythema is improved.  Less swollen.  Induration is less.  Redness  involves the periareolar area but less .  Open wound noted.  Impression right breast abscess status post incision and drainage  Plan:  . She is off doxycycline . I've asked her again to massage the breast in the shower.She is much better.  Return 1 month to see if anything else needs to be done.

## 2011-08-30 ENCOUNTER — Ambulatory Visit (INDEPENDENT_AMBULATORY_CARE_PROVIDER_SITE_OTHER): Payer: Federal, State, Local not specified - PPO | Admitting: Surgery

## 2011-08-30 ENCOUNTER — Encounter (INDEPENDENT_AMBULATORY_CARE_PROVIDER_SITE_OTHER): Payer: Self-pay | Admitting: Surgery

## 2011-08-30 VITALS — BP 126/84 | HR 84 | Resp 18 | Ht 64.0 in | Wt 143.4 lb

## 2011-08-30 DIAGNOSIS — N61 Mastitis without abscess: Secondary | ICD-10-CM

## 2011-08-30 NOTE — Progress Notes (Signed)
The patient returns to day for followup of a right breast abscess.She is feeling much better.Denies pain.  Still a little swollen.   Exam: Right breast erythema is improved.  Less swollen.  Induration is less.  Redness  involves the periareolar area but less .    Impression:  right breast abscess status post incision and drainage  Plan: Return 1 month.  Looks better.  Follow for lactiferous fistula.

## 2011-08-30 NOTE — Patient Instructions (Signed)
Return 1 month

## 2011-10-04 ENCOUNTER — Encounter (INDEPENDENT_AMBULATORY_CARE_PROVIDER_SITE_OTHER): Payer: Self-pay | Admitting: Surgery

## 2011-10-04 ENCOUNTER — Ambulatory Visit (INDEPENDENT_AMBULATORY_CARE_PROVIDER_SITE_OTHER): Payer: Federal, State, Local not specified - PPO | Admitting: Surgery

## 2011-10-04 VITALS — BP 107/67 | HR 81 | Temp 98.0°F | Ht 64.0 in | Wt 144.0 lb

## 2011-10-04 DIAGNOSIS — N6019 Diffuse cystic mastopathy of unspecified breast: Secondary | ICD-10-CM

## 2011-10-04 NOTE — Patient Instructions (Signed)
Return if symptoms worsen

## 2011-10-04 NOTE — Progress Notes (Signed)
The patient returns to day for followup of a right breast abscess.She is feeling much better.Denies pain.  Still a little swollen.   Exam: Right breast erythema is resolved.  Less swollen.  Induration is resolved.  Redness resolved .    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  Plan No evidence of fistula.  Return as needed.

## 2014-04-29 ENCOUNTER — Encounter (INDEPENDENT_AMBULATORY_CARE_PROVIDER_SITE_OTHER): Payer: Self-pay | Admitting: Surgery

## 2015-10-09 DIAGNOSIS — Z3042 Encounter for surveillance of injectable contraceptive: Secondary | ICD-10-CM | POA: Diagnosis not present

## 2016-01-02 DIAGNOSIS — Z3042 Encounter for surveillance of injectable contraceptive: Secondary | ICD-10-CM | POA: Diagnosis not present

## 2016-01-29 DIAGNOSIS — Z6822 Body mass index (BMI) 22.0-22.9, adult: Secondary | ICD-10-CM | POA: Diagnosis not present

## 2016-01-29 DIAGNOSIS — Z1389 Encounter for screening for other disorder: Secondary | ICD-10-CM | POA: Diagnosis not present

## 2016-01-29 DIAGNOSIS — G43709 Chronic migraine without aura, not intractable, without status migrainosus: Secondary | ICD-10-CM | POA: Diagnosis not present

## 2016-03-23 DIAGNOSIS — Z3042 Encounter for surveillance of injectable contraceptive: Secondary | ICD-10-CM | POA: Diagnosis not present

## 2016-06-08 DIAGNOSIS — Z3042 Encounter for surveillance of injectable contraceptive: Secondary | ICD-10-CM | POA: Diagnosis not present

## 2016-08-16 DIAGNOSIS — J019 Acute sinusitis, unspecified: Secondary | ICD-10-CM | POA: Diagnosis not present

## 2016-08-24 DIAGNOSIS — Z3040 Encounter for surveillance of contraceptives, unspecified: Secondary | ICD-10-CM | POA: Diagnosis not present

## 2016-09-10 DIAGNOSIS — Z01419 Encounter for gynecological examination (general) (routine) without abnormal findings: Secondary | ICD-10-CM | POA: Diagnosis not present

## 2016-09-10 DIAGNOSIS — Z6822 Body mass index (BMI) 22.0-22.9, adult: Secondary | ICD-10-CM | POA: Diagnosis not present

## 2016-09-10 DIAGNOSIS — Z1231 Encounter for screening mammogram for malignant neoplasm of breast: Secondary | ICD-10-CM | POA: Diagnosis not present

## 2016-11-04 DIAGNOSIS — Z3042 Encounter for surveillance of injectable contraceptive: Secondary | ICD-10-CM | POA: Diagnosis not present

## 2017-01-20 DIAGNOSIS — Z3042 Encounter for surveillance of injectable contraceptive: Secondary | ICD-10-CM | POA: Diagnosis not present

## 2017-04-07 DIAGNOSIS — J019 Acute sinusitis, unspecified: Secondary | ICD-10-CM | POA: Diagnosis not present

## 2017-04-07 DIAGNOSIS — Z3042 Encounter for surveillance of injectable contraceptive: Secondary | ICD-10-CM | POA: Diagnosis not present

## 2017-07-01 DIAGNOSIS — Z3042 Encounter for surveillance of injectable contraceptive: Secondary | ICD-10-CM | POA: Diagnosis not present

## 2017-09-23 DIAGNOSIS — Z1231 Encounter for screening mammogram for malignant neoplasm of breast: Secondary | ICD-10-CM | POA: Diagnosis not present

## 2017-09-23 DIAGNOSIS — Z3042 Encounter for surveillance of injectable contraceptive: Secondary | ICD-10-CM | POA: Diagnosis not present

## 2017-10-07 DIAGNOSIS — Z01419 Encounter for gynecological examination (general) (routine) without abnormal findings: Secondary | ICD-10-CM | POA: Diagnosis not present

## 2017-10-07 DIAGNOSIS — Z6823 Body mass index (BMI) 23.0-23.9, adult: Secondary | ICD-10-CM | POA: Diagnosis not present

## 2017-12-09 DIAGNOSIS — Z3042 Encounter for surveillance of injectable contraceptive: Secondary | ICD-10-CM | POA: Diagnosis not present

## 2017-12-28 DIAGNOSIS — J019 Acute sinusitis, unspecified: Secondary | ICD-10-CM | POA: Diagnosis not present

## 2018-02-24 DIAGNOSIS — Z3042 Encounter for surveillance of injectable contraceptive: Secondary | ICD-10-CM | POA: Diagnosis not present

## 2018-05-19 DIAGNOSIS — Z3042 Encounter for surveillance of injectable contraceptive: Secondary | ICD-10-CM | POA: Diagnosis not present

## 2018-07-14 DIAGNOSIS — J019 Acute sinusitis, unspecified: Secondary | ICD-10-CM | POA: Diagnosis not present

## 2018-08-11 DIAGNOSIS — Z3042 Encounter for surveillance of injectable contraceptive: Secondary | ICD-10-CM | POA: Diagnosis not present

## 2018-11-03 DIAGNOSIS — Z3042 Encounter for surveillance of injectable contraceptive: Secondary | ICD-10-CM | POA: Diagnosis not present

## 2018-11-17 DIAGNOSIS — Z1231 Encounter for screening mammogram for malignant neoplasm of breast: Secondary | ICD-10-CM | POA: Diagnosis not present

## 2018-11-17 DIAGNOSIS — Z6841 Body Mass Index (BMI) 40.0 and over, adult: Secondary | ICD-10-CM | POA: Diagnosis not present

## 2018-11-17 DIAGNOSIS — Z1151 Encounter for screening for human papillomavirus (HPV): Secondary | ICD-10-CM | POA: Diagnosis not present

## 2018-11-17 DIAGNOSIS — Z01419 Encounter for gynecological examination (general) (routine) without abnormal findings: Secondary | ICD-10-CM | POA: Diagnosis not present

## 2019-01-25 DIAGNOSIS — Z3042 Encounter for surveillance of injectable contraceptive: Secondary | ICD-10-CM | POA: Diagnosis not present

## 2019-01-26 DIAGNOSIS — Z6823 Body mass index (BMI) 23.0-23.9, adult: Secondary | ICD-10-CM | POA: Diagnosis not present

## 2019-01-26 DIAGNOSIS — R51 Headache: Secondary | ICD-10-CM | POA: Diagnosis not present

## 2019-01-26 DIAGNOSIS — Z1389 Encounter for screening for other disorder: Secondary | ICD-10-CM | POA: Diagnosis not present

## 2019-08-10 DIAGNOSIS — G43009 Migraine without aura, not intractable, without status migrainosus: Secondary | ICD-10-CM | POA: Diagnosis not present

## 2019-08-10 DIAGNOSIS — E663 Overweight: Secondary | ICD-10-CM | POA: Diagnosis not present

## 2019-08-10 DIAGNOSIS — Z6825 Body mass index (BMI) 25.0-25.9, adult: Secondary | ICD-10-CM | POA: Diagnosis not present

## 2019-08-10 DIAGNOSIS — G4489 Other headache syndrome: Secondary | ICD-10-CM | POA: Diagnosis not present

## 2019-09-20 DIAGNOSIS — Z23 Encounter for immunization: Secondary | ICD-10-CM | POA: Diagnosis not present

## 2019-10-13 DIAGNOSIS — Z23 Encounter for immunization: Secondary | ICD-10-CM | POA: Diagnosis not present

## 2019-12-28 DIAGNOSIS — Z6824 Body mass index (BMI) 24.0-24.9, adult: Secondary | ICD-10-CM | POA: Diagnosis not present

## 2019-12-28 DIAGNOSIS — Z1231 Encounter for screening mammogram for malignant neoplasm of breast: Secondary | ICD-10-CM | POA: Diagnosis not present

## 2019-12-28 DIAGNOSIS — Z01419 Encounter for gynecological examination (general) (routine) without abnormal findings: Secondary | ICD-10-CM | POA: Diagnosis not present

## 2020-06-30 DIAGNOSIS — Z20828 Contact with and (suspected) exposure to other viral communicable diseases: Secondary | ICD-10-CM | POA: Diagnosis not present

## 2021-02-19 DIAGNOSIS — Z6824 Body mass index (BMI) 24.0-24.9, adult: Secondary | ICD-10-CM | POA: Diagnosis not present

## 2021-02-19 DIAGNOSIS — Z3009 Encounter for other general counseling and advice on contraception: Secondary | ICD-10-CM | POA: Diagnosis not present

## 2021-02-19 DIAGNOSIS — Z01419 Encounter for gynecological examination (general) (routine) without abnormal findings: Secondary | ICD-10-CM | POA: Diagnosis not present

## 2021-02-19 DIAGNOSIS — Z1231 Encounter for screening mammogram for malignant neoplasm of breast: Secondary | ICD-10-CM | POA: Diagnosis not present

## 2021-02-27 DIAGNOSIS — Z3042 Encounter for surveillance of injectable contraceptive: Secondary | ICD-10-CM | POA: Diagnosis not present

## 2021-04-07 ENCOUNTER — Encounter: Payer: Self-pay | Admitting: Internal Medicine

## 2021-05-15 DIAGNOSIS — Z3042 Encounter for surveillance of injectable contraceptive: Secondary | ICD-10-CM | POA: Diagnosis not present

## 2021-05-29 ENCOUNTER — Ambulatory Visit (AMBULATORY_SURGERY_CENTER): Payer: Federal, State, Local not specified - PPO | Admitting: *Deleted

## 2021-05-29 ENCOUNTER — Other Ambulatory Visit: Payer: Self-pay

## 2021-05-29 VITALS — Ht 64.0 in | Wt 140.0 lb

## 2021-05-29 DIAGNOSIS — Z1211 Encounter for screening for malignant neoplasm of colon: Secondary | ICD-10-CM

## 2021-05-29 DIAGNOSIS — Z800 Family history of malignant neoplasm of digestive organs: Secondary | ICD-10-CM

## 2021-05-29 MED ORDER — PLENVU 140 G PO SOLR: 1.0000 | Freq: Once | ORAL | 0 refills | Status: AC

## 2021-05-29 NOTE — Progress Notes (Signed)
Patient's pre-visit was done today over the phone with the patient. Name,DOB and address verified. Patient denies any allergies to Eggs and Soy. Patient denies any problems with anesthesia/sedation. Patient is not taking any diet pills or blood thinners. No home Oxygen. Packet of Prep instructions mailed to patient including a copy of a consent form-pt is aware. Patient understands to call us back with any questions or concerns. Patient is aware of our care-partner policy and [id number] safety protocol.  The patient is COVID-19 vaccinated.

## 2021-06-12 ENCOUNTER — Encounter: Payer: Self-pay | Admitting: Internal Medicine

## 2021-06-12 ENCOUNTER — Other Ambulatory Visit: Payer: Self-pay

## 2021-06-12 ENCOUNTER — Ambulatory Visit (AMBULATORY_SURGERY_CENTER): Payer: Federal, State, Local not specified - PPO | Admitting: Internal Medicine

## 2021-06-12 VITALS — BP 116/60 | HR 82 | Temp 98.9°F | Resp 14 | Ht 64.0 in | Wt 140.0 lb

## 2021-06-12 DIAGNOSIS — K635 Polyp of colon: Secondary | ICD-10-CM

## 2021-06-12 DIAGNOSIS — Z1211 Encounter for screening for malignant neoplasm of colon: Secondary | ICD-10-CM

## 2021-06-12 DIAGNOSIS — D120 Benign neoplasm of cecum: Secondary | ICD-10-CM

## 2021-06-12 DIAGNOSIS — Z800 Family history of malignant neoplasm of digestive organs: Secondary | ICD-10-CM | POA: Diagnosis not present

## 2021-06-12 DIAGNOSIS — D123 Benign neoplasm of transverse colon: Secondary | ICD-10-CM

## 2021-06-12 MED ORDER — SODIUM CHLORIDE 0.9 % IV SOLN: 500.0000 mL | Freq: Once | INTRAVENOUS | Status: DC

## 2021-06-12 NOTE — Progress Notes (Signed)
Called to room to assist during endoscopic procedure.  Patient ID and intended procedure confirmed with present staff. Received instructions for my participation in the procedure from the performing physician.  

## 2021-06-12 NOTE — Progress Notes (Signed)
VS completed by CW.   Pt's states no medical or surgical changes since previsit or office visit.  

## 2021-06-12 NOTE — Progress Notes (Signed)
GASTROENTEROLOGY PROCEDURE H&P NOTE   Primary Care Physician: Redmond School, MD    Reason for Procedure:   High risk colon cancer screening  Plan:    Colonoscopy  Patient is appropriate for endoscopic procedure(s) in the ambulatory ([facility]) setting.  The nature of the procedure, as well as the risks, benefits, and alternatives were carefully and thoroughly reviewed with the patient. Ample time for discussion and questions allowed. The patient understood, was satisfied, and agreed to proceed.     HPI: Kim Guerrero is a 45 y.o. female who presents for colonoscopy for high risk colon cancer screening. Her father had colon cancer age 10, and she has had a paternal grandmother, paternal great-grandmother, paternal uncle all develop colon cancer. Denies blood in stools, changes in bowel habits, weight loss. Denies use of blood thinners.  Past Medical History:  Diagnosis Date   Acute blood loss anemia 06/15/2011   AMA (advanced maternal age) multigravida 14+    Anal fissure    Breast abscess of female    both   Headache(784.0)     migraine   History of chicken pox    Supervision of other high-risk pregnancy(V23.89)    B strep    Past Surgical History:  Procedure Laterality Date   arm surgery  06/28/2008   Left   NASAL SEPTUM SURGERY     perforated ulcer and peritonitis  06/29/2007   shoulder durgery     Left   UPPER GASTROINTESTINAL ENDOSCOPY  2011   at [facility]    Prior to Admission medications   Medication Sig Start Date End Date Taking? Authorizing Provider  medroxyPROGESTERone Acetate (DEPO-PROVERA IM) Inject into the muscle.    [provider]    Current Outpatient Medications  Medication Sig Dispense Refill   medroxyPROGESTERone Acetate (DEPO-PROVERA IM) Inject into the muscle.     Current Facility-Administered Medications  Medication Dose Route Frequency Provider Last Rate Last Admin   0.9 %  sodium chloride infusion  500 mL Intravenous  Once Sharyn Creamer, MD        Allergies as of 06/12/2021   (No Known Allergies)    Family History  Problem Relation Age of Onset   Colon cancer Father 29   Colon cancer Paternal Uncle    Colon cancer Paternal Grandmother    Migraines Paternal Grandfather    Cancer Paternal Grandfather        lung   Colon cancer Paternal Great-grandmother     Social History   Socioeconomic History   Marital status: Married    Spouse name: Not on file   Number of children: Not on file   Years of education: Not on file   Highest education level: Not on file  Occupational History   Not on file  Tobacco Use   Smoking status: Never   Smokeless tobacco: Never  Vaping Use   Vaping Use: Never used  Substance and Sexual Activity   Alcohol use: No   Drug use: No   Sexual activity: Yes  Other Topics Concern   Not on file  Social History Narrative   Not on file   Social Determinants of Health   Financial Resource Strain: Not on file  Food Insecurity: Not on file  Transportation Needs: Not on file  Physical Activity: Not on file  Stress: Not on file  Social Connections: Not on file  Intimate Partner Violence: Not on file    Physical Exam: Vital signs in last 24 hours: BP  120/72    Pulse 72    Temp 98.9 F (37.2 C) (Temporal)    Ht 5\' 4"  (1.626 m)    Wt 140 lb (63.5 kg)    SpO2 100%    BMI 24.03 kg/m  GEN: NAD EYE: Sclerae anicteric ENT: MMM CV: Non-tachycardic Pulm: No increased work of breathing GI: Soft, NT/ND NEURO:  Alert & Oriented   [name], MD [facility]  06/12/2021 8:41 AM

## 2021-06-12 NOTE — Op Note (Signed)
[facility] Patient Name: Kim Guerrero Procedure Date: 06/12/2021 8:47 AM MRN: [id number] Endoscopist: Sonny Masters "Kim Guerrero ,  Age: 45 Referring MD:  Date of Birth: [birth year] Gender: Female Account #: [id number] Procedure:                Colonoscopy Indications:              Screening in patient at increased risk: Family                            history of 1st-degree relative with colorectal                            cancer before age 78 years, This is the patient's                            first colonoscopy Medicines:                Monitored Anesthesia Care Procedure:                Pre-Anesthesia Assessment:                           - Prior to the procedure, a History and Physical                            was performed, and patient medications and                            allergies were reviewed. The patient's tolerance of                            previous anesthesia was also reviewed. The risks                            and benefits of the procedure and the sedation                            options and risks were discussed with the patient.                            All questions were answered, and informed consent                            was obtained. Prior Anticoagulants: The patient has                            taken no previous anticoagulant or antiplatelet                            agents. ASA Grade Assessment: I - A normal, healthy                            patient. After reviewing the risks and benefits,  the patient was deemed in satisfactory condition to                            undergo the procedure.                           After obtaining informed consent, the colonoscope                            was passed under direct vision. Throughout the                            procedure, the patient's blood pressure, pulse, and                            oxygen saturations were monitored continuously. The                             PCF-HQ190L Colonoscope was introduced through the                            anus and advanced to the the terminal ileum. The                            colonoscopy was performed without difficulty. The                            patient tolerated the procedure well. The quality                            of the bowel preparation was good. The terminal                            ileum, ileocecal valve, appendiceal orifice, and                            rectum were photographed. Scope In: 8:50:33 AM Scope Out: 9:14:39 AM Scope Withdrawal Time: 0 hours 16 minutes 0 seconds  Total Procedure Duration: 0 hours 24 minutes 6 seconds  Findings:                 The terminal ileum appeared normal.                           Three sessile polyps were found in the transverse                            colon and cecum. The polyps were 2 to 5 mm in size.                            These polyps were removed with a cold snare. Polyp                            resection was incomplete, and the resected tissue  was partially retrieved.                           Non-bleeding internal hemorrhoids were found during                            retroflexion. Complications:            No immediate complications. Estimated Blood Loss:     Estimated blood loss was minimal. Impression:               - The examined portion of the ileum was normal.                           - Three 2 to 5 mm polyps in the transverse colon                            and in the cecum, removed with a cold snare. Polyp                            resection was incomplete, and the resected tissue                            was partially retrieved.                           - Non-bleeding internal hemorrhoids. Recommendation:           - Discharge patient to home (with escort).                           - Await pathology results. Pending results, will                            determine  the timing of your next colonoscopy.                           - The findings and recommendations were discussed                            with the patient. Sonny Masters "Kim Guerrero,  06/12/2021 9:20:13 AM

## 2021-06-12 NOTE — Progress Notes (Signed)
To pacu, VSS. Report to [email or web address] 

## 2021-06-12 NOTE — Patient Instructions (Signed)
Handout given for polyps   YOU HAD AN ENDOSCOPIC PROCEDURE TODAY AT [facility]:   Refer to the procedure report that was given to you for any specific questions about what was found during the examination.  If the procedure report does not answer your questions, please call your gastroenterologist to clarify.  If you requested that your care partner not be given the details of your procedure findings, then the procedure report has been included in a sealed envelope for you to review at your convenience later.  YOU SHOULD EXPECT: Some feelings of bloating in the abdomen. Passage of more gas than usual.  Walking can help get rid of the air that was put into your GI tract during the procedure and reduce the bloating. If you had a lower endoscopy (such as a colonoscopy or flexible sigmoidoscopy) you may notice spotting of blood in your stool or on the toilet paper. If you underwent a bowel prep for your procedure, you may not have a normal bowel movement for a few days.  Please Note:  You might notice some irritation and congestion in your nose or some drainage.  This is from the oxygen used during your procedure.  There is no need for concern and it should clear up in a day or so.  SYMPTOMS TO REPORT IMMEDIATELY:  Following lower endoscopy (colonoscopy):  Excessive amounts of blood in the stool  Significant tenderness or worsening of abdominal pains  Swelling of the abdomen that is new, acute  Fever of 100F or higher  For urgent or emergent issues, a gastroenterologist can be reached at any hour by calling [phone]. Do not use MyChart messaging for urgent concerns.    DIET:  We do recommend a small meal at first, but then you may proceed to your regular diet.  Drink plenty of fluids but you should avoid alcoholic beverages for 24 hours.  ACTIVITY:  You should plan to take it easy for the rest of today and you should NOT DRIVE or use heavy machinery until tomorrow (because  of the sedation medicines used during the test).    FOLLOW UP: Our staff will call the number listed on your records 48-72 hours following your procedure to check on you and address any questions or concerns that you may have regarding the information given to you following your procedure. If we do not reach you, we will leave a message.  We will attempt to reach you two times.  During this call, we will ask if you have developed any symptoms of COVID 19. If you develop any symptoms (ie: fever, flu-like symptoms, shortness of breath, cough etc.) before then, please call [phone].  If you test positive for Covid 19 in the 2 weeks post procedure, please call and report this information to [place].    If any biopsies were taken you will be contacted by phone or by letter within the next 1-3 weeks.  Please call us at [phone] if you have not heard about the biopsies in 3 weeks.    SIGNATURES/CONFIDENTIALITY: You and/or your care partner have signed paperwork which will be entered into your electronic medical record.  These signatures attest to the fact that that the information above on your After Visit Summary has been reviewed and is understood.  Full responsibility of the confidentiality of this discharge information lies with you and/or your care-partner.

## 2021-06-16 ENCOUNTER — Telehealth: Payer: Self-pay

## 2021-06-16 ENCOUNTER — Encounter: Payer: Self-pay | Admitting: Internal Medicine

## 2021-06-16 NOTE — Telephone Encounter (Signed)
°  Follow up Call-  Call back number 06/12/2021  Post procedure Call Back phone  # [phone]  Permission to leave phone message Yes  Some recent data might be hidden     Patient questions:  Do you have a fever, pain , or abdominal swelling? No. Pain Score  0 *  Have you tolerated food without any problems? Yes.    Have you been able to return to your normal activities? Yes.    Do you have any questions about your discharge instructions: Diet   No. Medications  No. Follow up visit  No.  Do you have questions or concerns about your Care? No.  Actions: * If pain score is 4 or above: No action needed, pain <4.

## 2021-08-07 DIAGNOSIS — Z3042 Encounter for surveillance of injectable contraceptive: Secondary | ICD-10-CM | POA: Diagnosis not present

## 2021-10-30 DIAGNOSIS — Z3042 Encounter for surveillance of injectable contraceptive: Secondary | ICD-10-CM | POA: Diagnosis not present

## 2022-01-25 DIAGNOSIS — Z3042 Encounter for surveillance of injectable contraceptive: Secondary | ICD-10-CM | POA: Diagnosis not present

## 2022-03-05 DIAGNOSIS — Z01411 Encounter for gynecological examination (general) (routine) with abnormal findings: Secondary | ICD-10-CM | POA: Diagnosis not present

## 2022-03-05 DIAGNOSIS — Z01419 Encounter for gynecological examination (general) (routine) without abnormal findings: Secondary | ICD-10-CM | POA: Diagnosis not present

## 2022-03-05 DIAGNOSIS — Z113 Encounter for screening for infections with a predominantly sexual mode of transmission: Secondary | ICD-10-CM | POA: Diagnosis not present

## 2022-03-05 DIAGNOSIS — Z6825 Body mass index (BMI) 25.0-25.9, adult: Secondary | ICD-10-CM | POA: Diagnosis not present

## 2022-03-05 DIAGNOSIS — Z124 Encounter for screening for malignant neoplasm of cervix: Secondary | ICD-10-CM | POA: Diagnosis not present

## 2022-03-05 DIAGNOSIS — Z1231 Encounter for screening mammogram for malignant neoplasm of breast: Secondary | ICD-10-CM | POA: Diagnosis not present

## 2022-04-22 DIAGNOSIS — Z3042 Encounter for surveillance of injectable contraceptive: Secondary | ICD-10-CM | POA: Diagnosis not present
